# Patient Record
Sex: Female | Born: 1960 | Race: Black or African American | Hispanic: No | Smoking: Current every day smoker
Health system: Southern US, Community
[De-identification: ages and names within clinical notes are randomized; demographics above are authoritative.]

## PROBLEM LIST (undated history)

## (undated) HISTORY — PX: ENDOMETRIAL ABLATION: SHX621

## (undated) HISTORY — PX: BREAST CYST EXCISION: SHX579

---

## 2008-06-15 ENCOUNTER — Other Ambulatory Visit: Admission: RE | Admit: 2008-06-15 | Discharge: 2008-06-15 | Payer: Self-pay | Admitting: Obstetrics and Gynecology

## 2008-12-09 ENCOUNTER — Ambulatory Visit (HOSPITAL_COMMUNITY): Admission: RE | Admit: 2008-12-09 | Discharge: 2008-12-09 | Payer: Self-pay | Admitting: Obstetrics and Gynecology

## 2011-03-12 ENCOUNTER — Encounter (HOSPITAL_COMMUNITY)
Admission: RE | Admit: 2011-03-12 | Discharge: 2011-03-12 | Payer: Self-pay | Source: Ambulatory Visit | Attending: General Surgery | Admitting: General Surgery

## 2011-03-12 MED ORDER — ENOXAPARIN SODIUM 40 MG/0.4ML ~~LOC~~ SOLN: 40.0000 mg | Freq: Once | SUBCUTANEOUS | Status: DC

## 2011-03-12 MED ORDER — CEFAZOLIN SODIUM 1-5 GM-% IV SOLN: 1.0000 g | INTRAVENOUS | Status: DC

## 2011-03-12 NOTE — H&P (Signed)
  NTS SOAP Note  Vital Signs:  Vitals as of: 02/20/2011: Systolic 139: Diastolic 63: Heart Rate 77: Temp 96.31F: Height 56ft 2in: Weight 134Lbs 0 Ounces: Pain Level 0: BMI 25  BMI : 24.51 kg/m2  Subjective: This 50 Years 36 Months old Female presents for of L breast mass.  Patient noted change in L upper outer breast near nipple approx 3 months ago.  Per patient, this has increased slightly over that time.  No fevers, chills.  No nipple d/c or changes.  No skin changes.  No similar issues or nodules in the past.  No sig family history.  No issues noted with R breast.  Review of Symptoms:  Constitutional:unremarkable Head:unremarkable Eyes:unremarkable Nose/Mouth/Throat:unremarkable Cardiovascular:unremarkable Respiratory:unremarkable Gastrointestinal:unremarkable Genitourinary:unremarkable Musculoskeletal:unremarkable Skin:unremarkable as per HPI Hematolgic/Lymphatic:unremarkable Allergic/Immunologic:unremarkable   Past Medical History:Obtained   Past Medical History  Pregnancy Gravida: 2 Pregnancy Para: 2 Surgical History: hysterectomy Medical Problems: seizures Psychiatric History: none Allergies: NKDA Medications: seizure med ? name   Social History:Obtained   Social History  Preferred Language: English ([place]) Race:  White Ethnicity: Not Hispanic / Latino Age: 50 Years 5 Months Marital Status:  M Alcohol:  No Recreational drug(s):  No   Smoking Status: Current every day smoker reviewed on 02/20/2011 Started Date: 05/22/1983 Packs per day: 1.00   Family History:Obtained   Family History  Is there a family history of:HTN, CAD    Objective Information: General:Well appearing, well nourished in no distress. Skin:no rash or prominent lesions Head:Atraumatic; no masses; no abnormalities Eyes:conjunctiva clear, EOM intact, PERRL Mouth:Mucous membranes moist, no mucosal  lesions. Neck:Supple without lymphadenopathy.  Heart:RRR, no murmur Lungs:CTA bilaterally, no wheezes, rhonchi, rales.  Breathing unlabored. L breast, small 1-2cm nodule UOQ.  Mobile.  Non-tender.  Firm.  No fluctuants. Abdomen:Soft, NT/ND, no HSM, no masses. Extremities:No deformities, clubbing, cyanosis, or edema.  Lymphatics:unremarkable  Assessment:   Diagnosis &amp; Procedure: DiagnosisCode: 174.9, ProcedureCode: 16109,    PO Final Path: Positive for malignancy PO UEA:[id number]  Plan:  L sided breast CA.  Surgical options discussed with patient.  Will need to proceed.  Patient is to consider and decide on partial vs total.  Benefits and disadvantages discussed.  Will tentatively schedule as total with [facility] bx but patient will call.  Follow-up:Pending Surgery

## 2011-03-16 ENCOUNTER — Encounter (HOSPITAL_COMMUNITY): Admission: RE | Payer: Self-pay | Source: Ambulatory Visit

## 2011-03-16 ENCOUNTER — Inpatient Hospital Stay (HOSPITAL_COMMUNITY): Admission: RE | Admit: 2011-03-16 | Payer: Self-pay | Source: Ambulatory Visit | Admitting: General Surgery

## 2011-03-16 SURGERY — MASTECTOMY WITH SENTINEL LYMPH NODE BIOPSY
Anesthesia: General | Laterality: Left

## 2011-07-26 ENCOUNTER — Other Ambulatory Visit (HOSPITAL_COMMUNITY): Payer: Self-pay | Admitting: Family Medicine

## 2011-07-26 DIAGNOSIS — Z139 Encounter for screening, unspecified: Secondary | ICD-10-CM

## 2011-07-30 ENCOUNTER — Inpatient Hospital Stay (HOSPITAL_COMMUNITY): Admission: RE | Admit: 2011-07-30 | Payer: Self-pay | Source: Ambulatory Visit

## 2011-08-10 ENCOUNTER — Ambulatory Visit (HOSPITAL_COMMUNITY): Payer: Self-pay

## 2011-10-31 ENCOUNTER — Other Ambulatory Visit (HOSPITAL_COMMUNITY)
Admission: RE | Admit: 2011-10-31 | Discharge: 2011-10-31 | Disposition: A | Discharge status: Home / Self Care | Payer: Self-pay | Source: Ambulatory Visit | Attending: Obstetrics and Gynecology | Admitting: Obstetrics and Gynecology

## 2011-10-31 ENCOUNTER — Other Ambulatory Visit: Payer: Self-pay | Admitting: Obstetrics and Gynecology

## 2011-10-31 DIAGNOSIS — Z01419 Encounter for gynecological examination (general) (routine) without abnormal findings: Secondary | ICD-10-CM | POA: Insufficient documentation

## 2012-01-16 ENCOUNTER — Other Ambulatory Visit: Payer: Self-pay | Admitting: Obstetrics and Gynecology

## 2012-08-12 ENCOUNTER — Other Ambulatory Visit: Payer: Self-pay | Admitting: Obstetrics and Gynecology

## 2012-08-13 ENCOUNTER — Other Ambulatory Visit: Payer: Self-pay | Admitting: Obstetrics and Gynecology

## 2012-12-01 ENCOUNTER — Encounter: Payer: Self-pay | Admitting: *Deleted

## 2012-12-01 ENCOUNTER — Other Ambulatory Visit: Payer: Self-pay | Admitting: Obstetrics and Gynecology

## 2012-12-17 ENCOUNTER — Ambulatory Visit: Payer: Self-pay | Admitting: Obstetrics and Gynecology

## 2012-12-22 ENCOUNTER — Ambulatory Visit: Payer: Self-pay | Admitting: Obstetrics and Gynecology

## 2013-01-21 ENCOUNTER — Other Ambulatory Visit: Payer: Self-pay | Admitting: Obstetrics and Gynecology

## 2013-01-21 ENCOUNTER — Other Ambulatory Visit: Payer: Self-pay | Admitting: *Deleted

## 2013-01-21 MED ORDER — IBUPROFEN 600 MG PO TABS: 600.0000 mg | ORAL_TABLET | Freq: Four times a day (QID) | ORAL | Status: DC | PRN

## 2013-07-19 ENCOUNTER — Emergency Department (HOSPITAL_COMMUNITY)
Admission: EM | Admit: 2013-07-19 | Discharge: 2013-07-19 | Disposition: A | Discharge status: Home / Self Care | Payer: 59 | Attending: Emergency Medicine | Admitting: Emergency Medicine

## 2013-07-19 ENCOUNTER — Encounter (HOSPITAL_COMMUNITY): Payer: Self-pay | Admitting: Emergency Medicine

## 2013-07-19 ENCOUNTER — Emergency Department (HOSPITAL_COMMUNITY): Payer: 59

## 2013-07-19 DIAGNOSIS — B9689 Other specified bacterial agents as the cause of diseases classified elsewhere: Secondary | ICD-10-CM | POA: Insufficient documentation

## 2013-07-19 DIAGNOSIS — N760 Acute vaginitis: Secondary | ICD-10-CM

## 2013-07-19 DIAGNOSIS — R197 Diarrhea, unspecified: Secondary | ICD-10-CM | POA: Insufficient documentation

## 2013-07-19 DIAGNOSIS — A499 Bacterial infection, unspecified: Secondary | ICD-10-CM | POA: Insufficient documentation

## 2013-07-19 DIAGNOSIS — N898 Other specified noninflammatory disorders of vagina: Secondary | ICD-10-CM | POA: Insufficient documentation

## 2013-07-19 DIAGNOSIS — F17200 Nicotine dependence, unspecified, uncomplicated: Secondary | ICD-10-CM | POA: Insufficient documentation

## 2013-07-19 DIAGNOSIS — Z3202 Encounter for pregnancy test, result negative: Secondary | ICD-10-CM | POA: Insufficient documentation

## 2013-07-19 DIAGNOSIS — N939 Abnormal uterine and vaginal bleeding, unspecified: Secondary | ICD-10-CM

## 2013-07-19 LAB — URINALYSIS, ROUTINE W REFLEX MICROSCOPIC
Bilirubin Urine: NEGATIVE
Glucose, UA: NEGATIVE mg/dL
Ketones, ur: NEGATIVE mg/dL
Leukocytes, UA: NEGATIVE
Nitrite: NEGATIVE
Protein, ur: NEGATIVE mg/dL
Specific Gravity, Urine: 1.01 (ref 1.005–1.030)
Urobilinogen, UA: 0.2 mg/dL (ref 0.0–1.0)
pH: 6 (ref 5.0–8.0)

## 2013-07-19 LAB — PREGNANCY, URINE: Preg Test, Ur: NEGATIVE

## 2013-07-19 LAB — CBC WITH DIFFERENTIAL/PLATELET
Basophils Absolute: 0 10*3/uL (ref 0.0–0.1)
Basophils Relative: 0 % (ref 0–1)
Eosinophils Absolute: 0.1 10*3/uL (ref 0.0–0.7)
Eosinophils Relative: 2 % (ref 0–5)
HCT: 39.9 % (ref 36.0–46.0)
Hemoglobin: 13 g/dL (ref 12.0–15.0)
Lymphocytes Relative: 40 % (ref 12–46)
Lymphs Abs: 2.4 10*3/uL (ref 0.7–4.0)
MCH: 29.5 pg (ref 26.0–34.0)
MCHC: 32.6 g/dL (ref 30.0–36.0)
MCV: 90.7 fL (ref 78.0–100.0)
Monocytes Absolute: 0.8 10*3/uL (ref 0.1–1.0)
Monocytes Relative: 13 % — ABNORMAL HIGH (ref 3–12)
Neutro Abs: 2.8 10*3/uL (ref 1.7–7.7)
Neutrophils Relative %: 45 % (ref 43–77)
Platelets: 295 10*3/uL (ref 150–400)
RBC: 4.4 MIL/uL (ref 3.87–5.11)
RDW: 16.3 % — ABNORMAL HIGH (ref 11.5–15.5)
WBC: 6.1 10*3/uL (ref 4.0–10.5)

## 2013-07-19 LAB — BASIC METABOLIC PANEL
BUN: 9 mg/dL (ref 6–23)
CO2: 30 mEq/L (ref 19–32)
Calcium: 9.8 mg/dL (ref 8.4–10.5)
Chloride: 99 mEq/L (ref 96–112)
Creatinine, Ser: 0.66 mg/dL (ref 0.50–1.10)
GFR calc Af Amer: >90 mL/min (ref >90)
GFR calc non Af Amer: >90 mL/min (ref >90)
Glucose, Bld: 87 mg/dL (ref 70–99)
Potassium: 3.7 mEq/L (ref 3.7–5.3)
Sodium: 138 mEq/L (ref 137–147)

## 2013-07-19 LAB — WET PREP, GENITAL
Trich, Wet Prep: NONE SEEN
Yeast Wet Prep HPF POC: NONE SEEN

## 2013-07-19 LAB — URINE MICROSCOPIC-ADD ON

## 2013-07-19 MED ORDER — ONDANSETRON HCL 4 MG/2ML IJ SOLN
4.0000 mg | Freq: Once | INTRAMUSCULAR | Status: DC
  Filled 2013-07-19: qty 2

## 2013-07-19 MED ORDER — PROMETHAZINE HCL 25 MG PO TABS: 25.0000 mg | ORAL_TABLET | Freq: Four times a day (QID) | ORAL | Status: AC | PRN

## 2013-07-19 MED ORDER — IOHEXOL 300 MG/ML  SOLN
50.0000 mL | Freq: Once | INTRAMUSCULAR | Status: AC | PRN
  Administered 2013-07-19: 50 mL via ORAL

## 2013-07-19 MED ORDER — OXYCODONE-ACETAMINOPHEN 5-325 MG PO TABS: 2.0000 | ORAL_TABLET | ORAL | Status: AC | PRN

## 2013-07-19 MED ORDER — ONDANSETRON HCL 4 MG/2ML IJ SOLN
4.0000 mg | Freq: Once | INTRAMUSCULAR | Status: AC
  Administered 2013-07-19: 4 mg via INTRAVENOUS

## 2013-07-19 MED ORDER — METRONIDAZOLE 500 MG PO TABS: 500.0000 mg | ORAL_TABLET | Freq: Two times a day (BID) | ORAL | Status: AC

## 2013-07-19 MED ORDER — MORPHINE SULFATE 4 MG/ML IJ SOLN
4.0000 mg | Freq: Once | INTRAMUSCULAR | Status: AC
  Administered 2013-07-19: 4 mg via INTRAVENOUS
  Filled 2013-07-19: qty 1

## 2013-07-19 MED ORDER — IOHEXOL 300 MG/ML  SOLN
100.0000 mL | Freq: Once | INTRAMUSCULAR | Status: AC | PRN
  Administered 2013-07-19: 100 mL via INTRAVENOUS

## 2013-07-19 MED ORDER — SODIUM CHLORIDE 0.9 % IV BOLUS (SEPSIS)
1000.0000 mL | Freq: Once | INTRAVENOUS | Status: AC
  Administered 2013-07-19: 1000 mL via INTRAVENOUS

## 2013-07-19 MED ORDER — ONDANSETRON HCL 4 MG/2ML IJ SOLN
4.0000 mg | Freq: Once | INTRAMUSCULAR | Status: AC
  Administered 2013-07-19: 4 mg via INTRAVENOUS
  Filled 2013-07-19: qty 2

## 2013-07-19 NOTE — ED Notes (Signed)
Pt with abd pain since Monday, diarrhea as well, denies N/V or fever, also with menses started on Monday as well, + vaginal bleeding, states she had procedure to prevent bleeding, also states passing blood clots

## 2013-07-19 NOTE — Discharge Instructions (Signed)
Abnormal Uterine Bleeding Abnormal uterine bleeding means bleeding from the vagina that is not your normal menstrual period. This can be:  Bleeding or spotting between periods.  Bleeding after sex (sexual intercourse).  Bleeding that is heavier or more than normal.  Periods that last longer than usual.  Bleeding after menopause. There are many problems that may cause this. Treatment will depend on the cause of the bleeding. Any kind of bleeding that is not normal should be reviewed by your doctor.  HOME CARE Watch your condition for any changes. These actions may lessen any discomfort you are having:  Do not use tampons or douches as told by your doctor.  Change your pads often. You should get regular pelvic exams and Pap tests. Keep all appointments for tests as told by your doctor. GET HELP IF:  You are bleeding for more than 1 week.  You feel dizzy at times. GET HELP RIGHT AWAY IF:   You pass out.  You have to change pads every 15 to 30 minutes.  You have belly pain.  You have a fever.  You become sweaty or weak.  You are passing large blood clots from the vagina.  You feel sick to your stomach (nauseous) and throw up (vomit). MAKE SURE YOU:  Understand these instructions.  Will watch your condition.  Will get help right away if you are not doing well or get worse. Document Released: 03/04/2009 Document Revised: 02/25/2013 Document Reviewed: 12/04/2012 [facility] Patient Information 2014 [place], [place].  Return tomorrow for an ultrasound of your pelvis. Nurse will give you the correct time. Medication for pain and nausea. You will ultimately will need to followup with your OB/GYN doctor

## 2013-07-19 NOTE — ED Provider Notes (Addendum)
CSN: [id number]     Arrival date & time 07/19/13  1632 History  This chart was scribed for Stacey Christen, MD by Stacey Adams, ED Scribe. This patient was seen in room APA05/APA05 and the patient's care was started at 5:13 PM.   Chief Complaint  Patient presents with  . Abdominal Pain   The history is provided by the patient. No language interpreter was used.    HPI Comments: Stacey Adams is a 53 y.o. female who presents to the Emergency Department complaining of severe vaginal bleeding for the past 6 days with associated LLQ pain. She reports a decreased appetite secondary to pain . She states that she became concerned this afternoon when she had a rush of blood about 2 hours ago. She reports that the LLQ pain started as a nagging sensation but has been gradually worsening since. She has tried Tylenol with mild improvement. Pt had an ablation done on November 3rd, 2014 by Dr. Evie Lacks in [place] for menorrhagia caused by uterine fibroids. She states that she is not supposed to have any further vaginal bleeding or menses and denies having any further episodes of vaginal bleeding up until 6 days ago. She also reports have diarrhea for the past 6 days as well but denies any nausea or emesis. She denies any chronic medical conditions.   Works as a Water engineer    History reviewed. No pertinent past medical history. Past Surgical History  Procedure Laterality Date  . Endometrial ablation     History reviewed. No pertinent family history. History  Substance Use Topics  . Smoking status: Current Every Day Smoker    Types: Cigarettes  . Smokeless tobacco: Not on file  . Alcohol Use: No   No OB history provided.  Review of Systems  A complete 10 system review of systems was obtained and all systems are negative except as noted in the HPI and PMH.    Allergies  Review of patient's allergies indicates no known allergies.  Home Medications   Current Outpatient Rx  Name  Route  Sig   Dispense  Refill  . oxyCODONE-acetaminophen (PERCOCET) 5-325 MG per tablet   Oral   Take 2 tablets by mouth every 4 (four) hours as needed.   20 tablet   0   . promethazine (PHENERGAN) 25 MG tablet   Oral   Take 1 tablet (25 mg total) by mouth every 6 (six) hours as needed.   15 tablet   0    Triage Vitals: BP 167/73  Pulse 71  Temp(Src) 98.2 F (36.8 C) (Oral)  Resp 16  Ht 5\' 6"  (1.676 m)  Wt 215 lb (97.523 kg)  BMI 34.72 kg/m2  SpO2 100%  LMP 07/13/2013  Physical Exam  Nursing note and vitals reviewed. Constitutional: She is oriented to person, place, and time. She appears well-developed and well-nourished.  HENT:  Head: Normocephalic and atraumatic.  Eyes: Conjunctivae and EOM are normal. Pupils are equal, round, and reactive to light.  Neck: Normal range of motion. Neck supple.  Cardiovascular: Normal rate, regular rhythm and normal heart sounds.   Pulmonary/Chest: Effort normal and breath sounds normal.  Abdominal: Soft. Bowel sounds are normal. There is tenderness (minimal LLQ).  Genitourinary:  External genitalia normal. No cervical motion tenderness. Cervix closed. Minimal blood in vault. Adnexa normal.  Musculoskeletal: Normal range of motion.  Neurological: She is alert and oriented to person, place, and time.  Skin: Skin is warm and dry.  Psychiatric: She has  a normal mood and affect. Her behavior is normal.    ED Course  Procedures (including critical care time)  DIAGNOSTIC STUDIES: Oxygen Saturation is 100% on RA, nml by my interpretation.    COORDINATION OF CARE: 5:18 PM-Discussed treatment plan which includes pelvic exam and CT of abdomen with pt at bedside and pt agreed to plan.   Labs Review Labs Reviewed  WET PREP, GENITAL - Abnormal; Notable for the following:    Clue Cells Wet Prep HPF POC MODERATE (*)    WBC, Wet Prep HPF POC FEW (*)    All other components within normal limits  CBC WITH DIFFERENTIAL - Abnormal; Notable for the following:     RDW 16.3 (*)    Monocytes Relative 13 (*)    All other components within normal limits  URINALYSIS, ROUTINE W REFLEX MICROSCOPIC - Abnormal; Notable for the following:    Hgb urine dipstick SMALL (*)    All other components within normal limits  GC/CHLAMYDIA PROBE AMP  BASIC METABOLIC PANEL  PREGNANCY, URINE  URINE MICROSCOPIC-ADD ON   Imaging Review Ct Abdomen Pelvis W Contrast  07/19/2013   CLINICAL DATA:  Recent endometrial ablation. Left lower quadrant pain and vaginal bleeding.  EXAM: CT ABDOMEN AND PELVIS WITH CONTRAST  TECHNIQUE: Multidetector CT imaging of the abdomen and pelvis was performed using the standard protocol following bolus administration of intravenous contrast.  CONTRAST:  29mL OMNIPAQUE IOHEXOL 300 MG/ML SOLN, 169mL OMNIPAQUE IOHEXOL 300 MG/ML SOLN  COMPARISON:  None.  FINDINGS: The endometrial canal demonstrates fluid attenuation and is widened to a maximum of 14 mm. There is some intermediate and somewhat heterogeneous material within the endometrial canal in the lower uterine segment. This may reflect thrombus. The uterus is mildly enlarged. It has a lobulated contour consistent with multiple fibroids. There is no evidence of a uterine perforation. No adnexal masses.  Clear lung bases. The heart is normal in size. There is a fluid attenuation oval mass just inferior to the inferior right pulmonary vein. This is likely a small bronchogenic cyst. It measures 2 cm in size. This is not felt to need additional evaluation.  Liver, spleen, gallbladder pancreas are normal. No bile duct dilation. No adrenal masses. Normal kidneys, ureters and bladder. No adenopathy. No ascites. There are scattered colonic diverticula. No diverticulitis. Bowel is otherwise unremarkable. Normal appendix.  There is loss of disc height with mild disc bulging at L4-L5. Bony structures otherwise unremarkable.  IMPRESSION: 1. Fluid mildly distends the endometrial canal. There is a material of heterogeneous  attenuation within the lower endometrial canal that may reflect thrombus. Consider endometritis in the proper clinical setting. 2. Uterus is mildly enlarged and heterogeneous with a lobulated contour consistent with multiple fibroids. 3. No evidence of uterine perforation. 4. Probable small bronchogenic cyst adjacent to the right inferior pulmonary vein. 5. Scattered colonic diverticula.  No diverticulitis. 6. Mild degenerative disc disease at L4-L5. Next and no other abnormalities.   Electronically Signed   By: Lajean Manes M.D.   On: 07/19/2013 19:18     EKG Interpretation None      MDM   Final diagnoses:  Vaginal bleeding  No acute abdomen. CT scan shows no acute findings. Will schedule ultrasound of pelvis for tomorrow. Patient will followup with her gynecologist. Discharge medications Percocet and Phenergan 25 mg... also Rx Flagyl 500 mg twice a day forone week  bacterial vaginosis  I personally performed the services described in this documentation, which was scribed in my presence.  The recorded information has been reviewed and is accurate.       Stacey Christen, MD 07/19/13 [id number]  Stacey Christen, MD 07/22/13 [place], MD 07/22/13 [phone]

## 2013-07-20 ENCOUNTER — Other Ambulatory Visit (HOSPITAL_COMMUNITY): Payer: Self-pay | Admitting: Emergency Medicine

## 2013-07-20 ENCOUNTER — Ambulatory Visit (HOSPITAL_COMMUNITY)
Admit: 2013-07-20 | Discharge: 2013-07-20 | Disposition: A | Discharge status: Home / Self Care | Payer: 59 | Attending: Emergency Medicine | Admitting: Emergency Medicine

## 2013-07-20 DIAGNOSIS — N920 Excessive and frequent menstruation with regular cycle: Secondary | ICD-10-CM | POA: Insufficient documentation

## 2013-07-20 DIAGNOSIS — D259 Leiomyoma of uterus, unspecified: Secondary | ICD-10-CM | POA: Insufficient documentation

## 2013-07-20 DIAGNOSIS — N939 Abnormal uterine and vaginal bleeding, unspecified: Secondary | ICD-10-CM

## 2013-07-20 NOTE — ED Provider Notes (Signed)
[place] with endometrial polyp r/o CA; Pt aware had Gyn f/u in 2 days.  Babette Relic, MD 07/20/13 [phone]

## 2013-07-21 LAB — GC/CHLAMYDIA PROBE AMP
CT Probe RNA: NEGATIVE
GC Probe RNA: NEGATIVE

## 2014-01-30 ENCOUNTER — Other Ambulatory Visit: Payer: Self-pay | Admitting: Obstetrics and Gynecology

## 2014-02-03 ENCOUNTER — Other Ambulatory Visit: Payer: Self-pay | Admitting: Obstetrics and Gynecology

## 2014-04-01 ENCOUNTER — Ambulatory Visit: Payer: Self-pay | Admitting: Obstetrics and Gynecology

## 2014-05-31 ENCOUNTER — Other Ambulatory Visit: Payer: Self-pay | Admitting: Obstetrics and Gynecology

## 2014-09-02 ENCOUNTER — Other Ambulatory Visit (HOSPITAL_COMMUNITY): Payer: Self-pay | Admitting: Family Medicine

## 2014-09-02 ENCOUNTER — Ambulatory Visit (HOSPITAL_COMMUNITY)
Admission: RE | Admit: 2014-09-02 | Discharge: 2014-09-02 | Disposition: A | Discharge status: Home / Self Care | Payer: 59 | Source: Ambulatory Visit | Attending: Family Medicine | Admitting: Family Medicine

## 2014-09-02 DIAGNOSIS — M25461 Effusion, right knee: Secondary | ICD-10-CM | POA: Diagnosis not present

## 2014-09-02 DIAGNOSIS — M25561 Pain in right knee: Secondary | ICD-10-CM

## 2016-06-12 ENCOUNTER — Other Ambulatory Visit (HOSPITAL_COMMUNITY): Payer: Self-pay | Admitting: Family Medicine

## 2016-06-12 DIAGNOSIS — Z1231 Encounter for screening mammogram for malignant neoplasm of breast: Secondary | ICD-10-CM

## 2016-06-14 ENCOUNTER — Ambulatory Visit (HOSPITAL_COMMUNITY)
Admission: RE | Admit: 2016-06-14 | Discharge: 2016-06-14 | Disposition: A | Discharge status: Home / Self Care | Payer: 59 | Source: Ambulatory Visit | Attending: Family Medicine | Admitting: Family Medicine

## 2016-06-14 DIAGNOSIS — Z1231 Encounter for screening mammogram for malignant neoplasm of breast: Secondary | ICD-10-CM

## 2018-02-04 DIAGNOSIS — Z6839 Body mass index (BMI) 39.0-39.9, adult: Secondary | ICD-10-CM | POA: Diagnosis not present

## 2018-02-04 DIAGNOSIS — M1991 Primary osteoarthritis, unspecified site: Secondary | ICD-10-CM | POA: Diagnosis not present

## 2018-02-04 DIAGNOSIS — M130 Polyarthritis, unspecified: Secondary | ICD-10-CM | POA: Diagnosis not present

## 2018-02-04 DIAGNOSIS — M2550 Pain in unspecified joint: Secondary | ICD-10-CM | POA: Diagnosis not present

## 2018-02-04 DIAGNOSIS — Z1389 Encounter for screening for other disorder: Secondary | ICD-10-CM | POA: Diagnosis not present

## 2018-06-09 DIAGNOSIS — Z1389 Encounter for screening for other disorder: Secondary | ICD-10-CM | POA: Diagnosis not present

## 2018-06-09 DIAGNOSIS — Z6839 Body mass index (BMI) 39.0-39.9, adult: Secondary | ICD-10-CM | POA: Diagnosis not present

## 2018-06-09 DIAGNOSIS — M130 Polyarthritis, unspecified: Secondary | ICD-10-CM | POA: Diagnosis not present

## 2018-06-09 DIAGNOSIS — F419 Anxiety disorder, unspecified: Secondary | ICD-10-CM | POA: Diagnosis not present

## 2018-10-14 DIAGNOSIS — F419 Anxiety disorder, unspecified: Secondary | ICD-10-CM | POA: Diagnosis not present

## 2018-10-14 DIAGNOSIS — Z6839 Body mass index (BMI) 39.0-39.9, adult: Secondary | ICD-10-CM | POA: Diagnosis not present

## 2018-10-14 DIAGNOSIS — Z1389 Encounter for screening for other disorder: Secondary | ICD-10-CM | POA: Diagnosis not present

## 2019-02-26 DIAGNOSIS — F419 Anxiety disorder, unspecified: Secondary | ICD-10-CM | POA: Diagnosis not present

## 2019-02-26 DIAGNOSIS — F330 Major depressive disorder, recurrent, mild: Secondary | ICD-10-CM | POA: Diagnosis not present

## 2019-02-26 DIAGNOSIS — Z6838 Body mass index (BMI) 38.0-38.9, adult: Secondary | ICD-10-CM | POA: Diagnosis not present

## 2019-02-26 DIAGNOSIS — Z23 Encounter for immunization: Secondary | ICD-10-CM | POA: Diagnosis not present

## 2019-02-26 DIAGNOSIS — M150 Primary generalized (osteo)arthritis: Secondary | ICD-10-CM | POA: Diagnosis not present

## 2019-04-06 DIAGNOSIS — L509 Urticaria, unspecified: Secondary | ICD-10-CM | POA: Diagnosis not present

## 2019-04-06 DIAGNOSIS — Z6837 Body mass index (BMI) 37.0-37.9, adult: Secondary | ICD-10-CM | POA: Diagnosis not present

## 2019-04-28 ENCOUNTER — Other Ambulatory Visit: Payer: Self-pay

## 2019-04-28 DIAGNOSIS — Z20822 Contact with and (suspected) exposure to covid-19: Secondary | ICD-10-CM

## 2019-04-30 LAB — NOVEL CORONAVIRUS, NAA: SARS-CoV-2, NAA: NOT DETECTED

## 2019-06-03 DIAGNOSIS — Z6837 Body mass index (BMI) 37.0-37.9, adult: Secondary | ICD-10-CM | POA: Diagnosis not present

## 2019-06-03 DIAGNOSIS — Z1151 Encounter for screening for human papillomavirus (HPV): Secondary | ICD-10-CM | POA: Diagnosis not present

## 2019-06-03 DIAGNOSIS — Z01419 Encounter for gynecological examination (general) (routine) without abnormal findings: Secondary | ICD-10-CM | POA: Diagnosis not present

## 2019-06-29 DIAGNOSIS — Z719 Counseling, unspecified: Secondary | ICD-10-CM | POA: Diagnosis not present

## 2019-06-29 DIAGNOSIS — T7840XD Allergy, unspecified, subsequent encounter: Secondary | ICD-10-CM | POA: Diagnosis not present

## 2019-06-29 DIAGNOSIS — Z6836 Body mass index (BMI) 36.0-36.9, adult: Secondary | ICD-10-CM | POA: Diagnosis not present

## 2019-06-29 DIAGNOSIS — L509 Urticaria, unspecified: Secondary | ICD-10-CM | POA: Diagnosis not present

## 2019-06-29 DIAGNOSIS — E7849 Other hyperlipidemia: Secondary | ICD-10-CM | POA: Diagnosis not present

## 2019-06-30 ENCOUNTER — Other Ambulatory Visit (HOSPITAL_COMMUNITY): Payer: Self-pay | Admitting: Family Medicine

## 2019-06-30 DIAGNOSIS — Z1231 Encounter for screening mammogram for malignant neoplasm of breast: Secondary | ICD-10-CM

## 2019-07-02 ENCOUNTER — Other Ambulatory Visit: Payer: Self-pay

## 2019-07-02 ENCOUNTER — Ambulatory Visit (HOSPITAL_COMMUNITY)
Admission: RE | Admit: 2019-07-02 | Discharge: 2019-07-02 | Disposition: A | Discharge status: Home / Self Care | Payer: BC Managed Care – PPO | Source: Ambulatory Visit | Attending: Family Medicine | Admitting: Family Medicine

## 2019-07-02 DIAGNOSIS — Z1231 Encounter for screening mammogram for malignant neoplasm of breast: Secondary | ICD-10-CM | POA: Diagnosis not present

## 2019-08-20 ENCOUNTER — Ambulatory Visit: Payer: BC Managed Care – PPO

## 2019-12-10 DIAGNOSIS — F419 Anxiety disorder, unspecified: Secondary | ICD-10-CM | POA: Diagnosis not present

## 2019-12-10 DIAGNOSIS — Z6837 Body mass index (BMI) 37.0-37.9, adult: Secondary | ICD-10-CM | POA: Diagnosis not present

## 2020-02-08 DIAGNOSIS — Z6836 Body mass index (BMI) 36.0-36.9, adult: Secondary | ICD-10-CM | POA: Diagnosis not present

## 2020-02-08 DIAGNOSIS — M545 Low back pain: Secondary | ICD-10-CM | POA: Diagnosis not present

## 2020-02-08 DIAGNOSIS — Z1389 Encounter for screening for other disorder: Secondary | ICD-10-CM | POA: Diagnosis not present

## 2020-02-08 DIAGNOSIS — E6609 Other obesity due to excess calories: Secondary | ICD-10-CM | POA: Diagnosis not present

## 2020-05-04 DIAGNOSIS — Z6836 Body mass index (BMI) 36.0-36.9, adult: Secondary | ICD-10-CM | POA: Diagnosis not present

## 2020-05-04 DIAGNOSIS — F419 Anxiety disorder, unspecified: Secondary | ICD-10-CM | POA: Diagnosis not present

## 2020-05-04 DIAGNOSIS — E6609 Other obesity due to excess calories: Secondary | ICD-10-CM | POA: Diagnosis not present

## 2020-05-31 ENCOUNTER — Other Ambulatory Visit (HOSPITAL_COMMUNITY): Payer: Self-pay | Admitting: Family Medicine

## 2020-05-31 DIAGNOSIS — Z1231 Encounter for screening mammogram for malignant neoplasm of breast: Secondary | ICD-10-CM

## 2020-06-21 ENCOUNTER — Other Ambulatory Visit: Payer: BC Managed Care – PPO

## 2020-06-21 DIAGNOSIS — J019 Acute sinusitis, unspecified: Secondary | ICD-10-CM | POA: Diagnosis not present

## 2020-06-21 DIAGNOSIS — Z20822 Contact with and (suspected) exposure to covid-19: Secondary | ICD-10-CM

## 2020-06-23 LAB — NOVEL CORONAVIRUS, NAA: SARS-CoV-2, NAA: NOT DETECTED

## 2020-06-23 LAB — SARS-COV-2, NAA 2 DAY TAT

## 2020-06-24 ENCOUNTER — Telehealth: Payer: Self-pay | Admitting: General Practice

## 2020-06-24 DIAGNOSIS — Z30432 Encounter for removal of intrauterine contraceptive device: Secondary | ICD-10-CM | POA: Diagnosis not present

## 2020-06-24 DIAGNOSIS — N938 Other specified abnormal uterine and vaginal bleeding: Secondary | ICD-10-CM | POA: Diagnosis not present

## 2020-06-24 NOTE — Telephone Encounter (Signed)
Patient called to get COVID results. Made her aware they were negative. 

## 2020-07-04 ENCOUNTER — Ambulatory Visit (HOSPITAL_COMMUNITY): Admission: RE | Admit: 2020-07-04 | Payer: BC Managed Care – PPO | Source: Ambulatory Visit

## 2020-08-04 DIAGNOSIS — F17210 Nicotine dependence, cigarettes, uncomplicated: Secondary | ICD-10-CM | POA: Diagnosis not present

## 2020-08-04 DIAGNOSIS — Z6836 Body mass index (BMI) 36.0-36.9, adult: Secondary | ICD-10-CM | POA: Diagnosis not present

## 2020-08-04 DIAGNOSIS — F329 Major depressive disorder, single episode, unspecified: Secondary | ICD-10-CM | POA: Diagnosis not present

## 2020-08-04 DIAGNOSIS — Z1389 Encounter for screening for other disorder: Secondary | ICD-10-CM | POA: Diagnosis not present

## 2020-08-04 DIAGNOSIS — M1991 Primary osteoarthritis, unspecified site: Secondary | ICD-10-CM | POA: Diagnosis not present

## 2020-08-04 DIAGNOSIS — R7309 Other abnormal glucose: Secondary | ICD-10-CM | POA: Diagnosis not present

## 2020-08-04 DIAGNOSIS — M150 Primary generalized (osteo)arthritis: Secondary | ICD-10-CM | POA: Diagnosis not present

## 2020-08-04 DIAGNOSIS — E7849 Other hyperlipidemia: Secondary | ICD-10-CM | POA: Diagnosis not present

## 2020-08-04 DIAGNOSIS — R000 Tachycardia, unspecified: Secondary | ICD-10-CM | POA: Diagnosis not present

## 2020-08-04 DIAGNOSIS — F419 Anxiety disorder, unspecified: Secondary | ICD-10-CM | POA: Diagnosis not present

## 2020-08-04 DIAGNOSIS — Z0001 Encounter for general adult medical examination with abnormal findings: Secondary | ICD-10-CM | POA: Diagnosis not present

## 2020-10-26 DIAGNOSIS — R609 Edema, unspecified: Secondary | ICD-10-CM | POA: Diagnosis not present

## 2020-10-26 DIAGNOSIS — E6609 Other obesity due to excess calories: Secondary | ICD-10-CM | POA: Diagnosis not present

## 2020-10-26 DIAGNOSIS — Z6836 Body mass index (BMI) 36.0-36.9, adult: Secondary | ICD-10-CM | POA: Diagnosis not present

## 2020-10-26 DIAGNOSIS — M1991 Primary osteoarthritis, unspecified site: Secondary | ICD-10-CM | POA: Diagnosis not present

## 2020-11-30 ENCOUNTER — Ambulatory Visit (HOSPITAL_COMMUNITY)
Admission: RE | Admit: 2020-11-30 | Discharge: 2020-11-30 | Disposition: A | Discharge status: Home / Self Care | Payer: BC Managed Care – PPO | Source: Ambulatory Visit | Attending: Family Medicine | Admitting: Family Medicine

## 2020-11-30 ENCOUNTER — Other Ambulatory Visit: Payer: Self-pay

## 2020-11-30 DIAGNOSIS — Z1231 Encounter for screening mammogram for malignant neoplasm of breast: Secondary | ICD-10-CM | POA: Diagnosis not present

## 2021-02-08 DIAGNOSIS — M150 Primary generalized (osteo)arthritis: Secondary | ICD-10-CM | POA: Diagnosis not present

## 2021-02-08 DIAGNOSIS — K219 Gastro-esophageal reflux disease without esophagitis: Secondary | ICD-10-CM | POA: Diagnosis not present

## 2021-02-08 DIAGNOSIS — F419 Anxiety disorder, unspecified: Secondary | ICD-10-CM | POA: Diagnosis not present

## 2021-07-31 IMAGING — MG DIGITAL SCREENING BILAT W/ TOMO W/ CAD
8 series · 8 of 24 positions shown · non-contrast
Comparison: Previous exam(s).

CLINICAL DATA: Screening.

EXAM:
DIGITAL SCREENING BILATERAL MAMMOGRAM WITH TOMO AND CAD

[L MLO synth-2D]
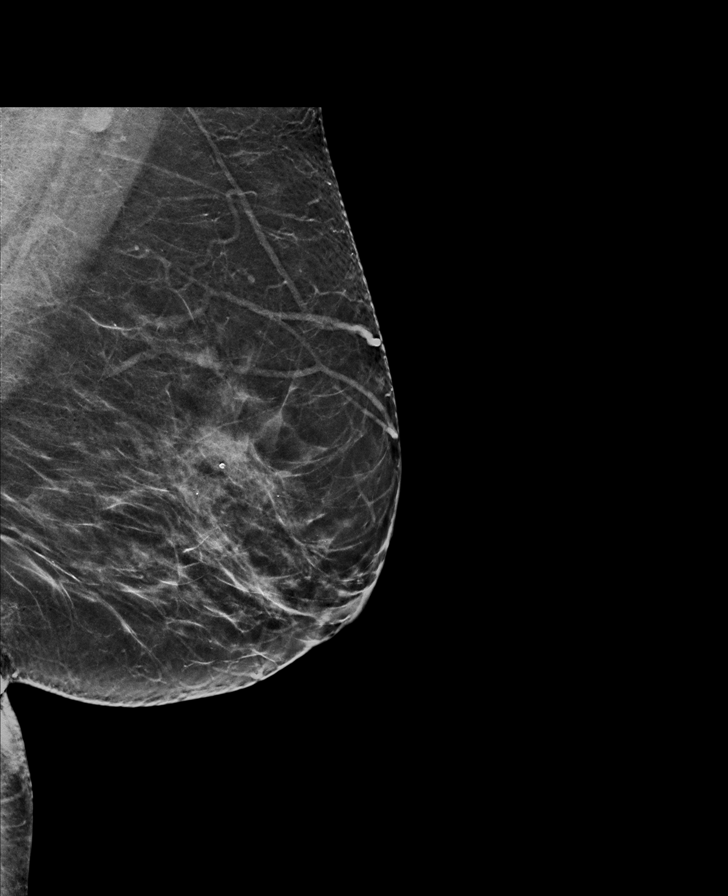

[L CC synth-2D]
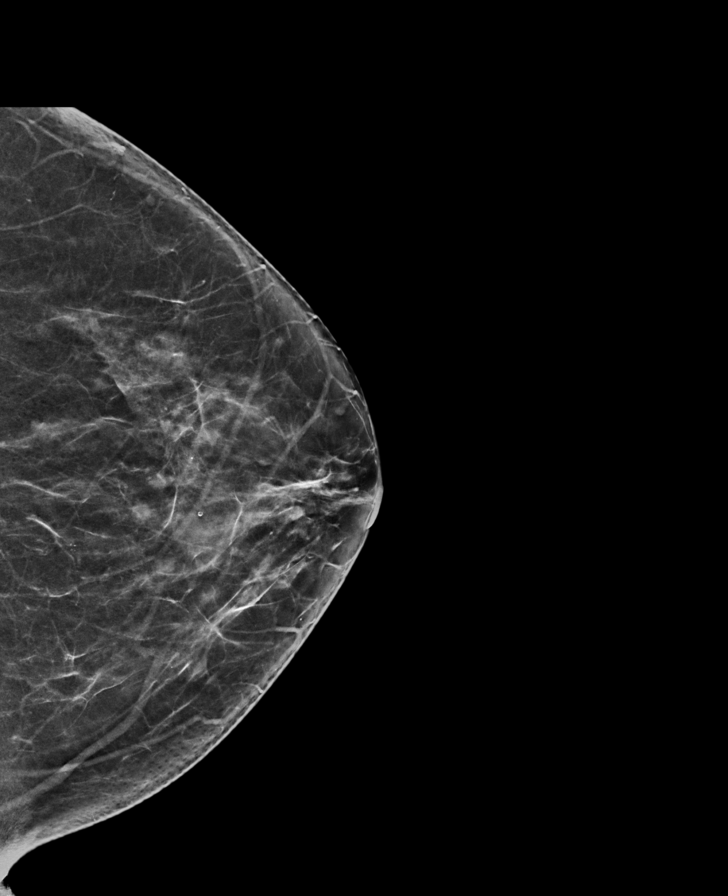

[R MLO synth-2D]
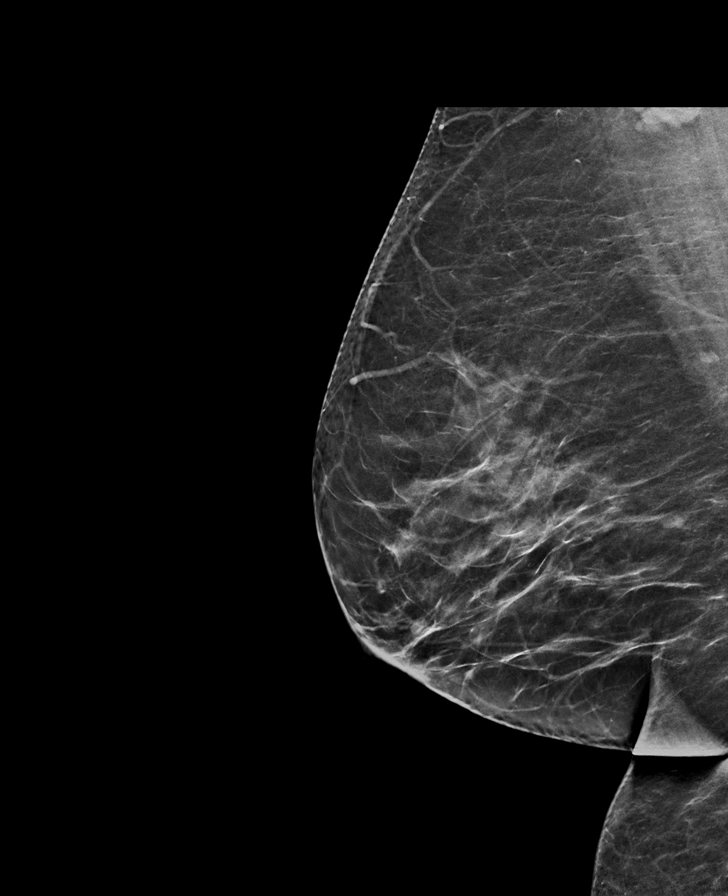

[R CC synth-2D]
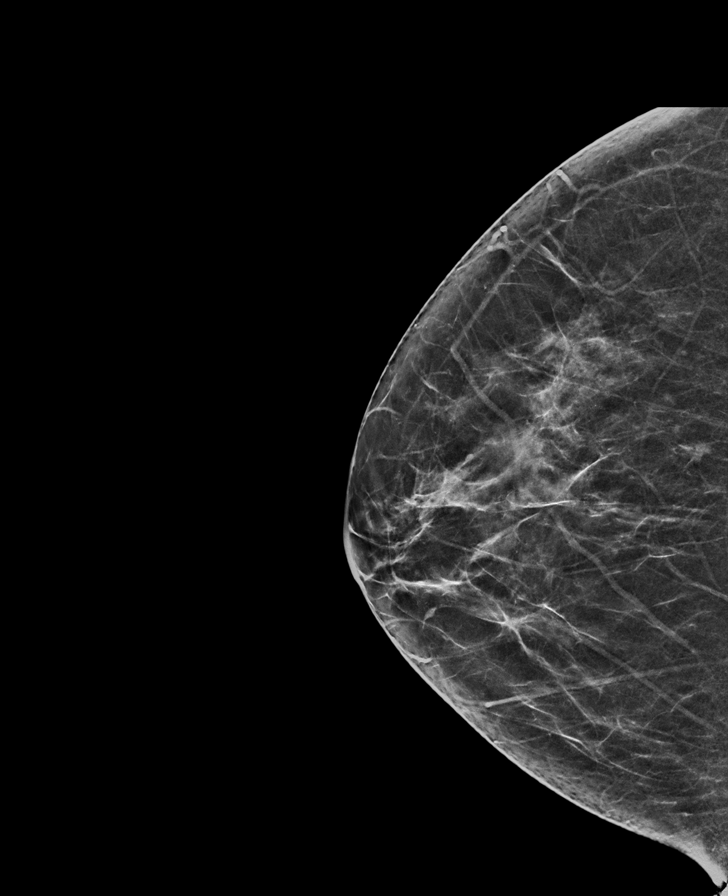

[R MLO tomo · tomo slice 34/67.0]
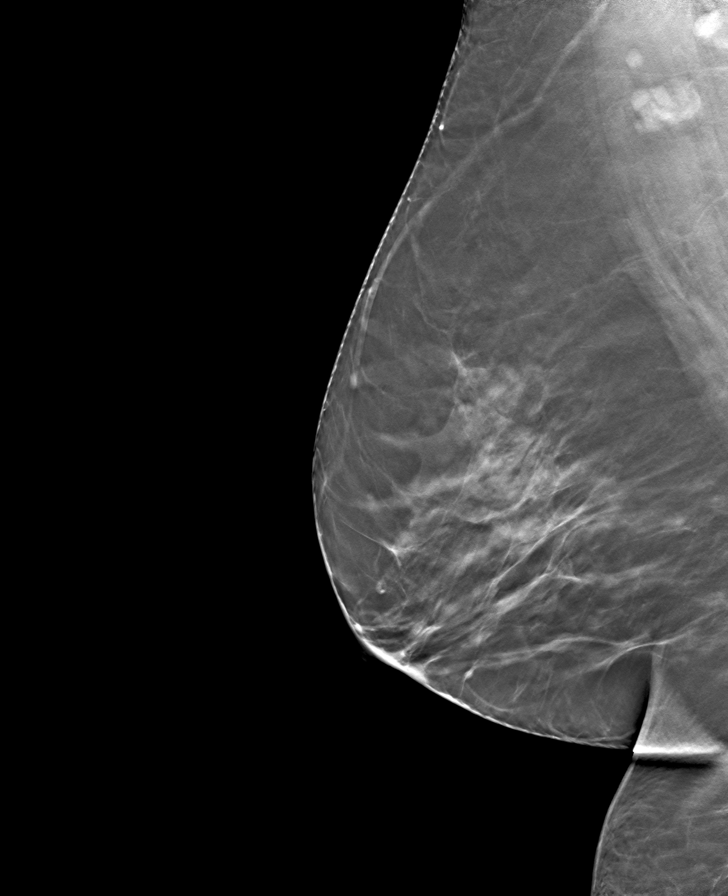

[R CC tomo · tomo slice 33/64.0]
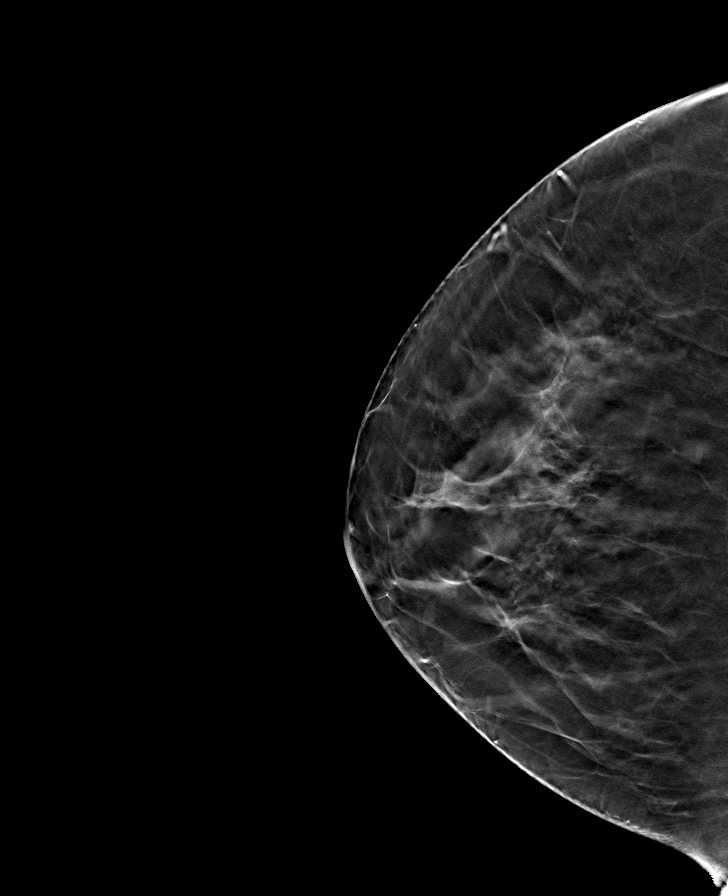

[L CC tomo · tomo slice 34/67.0]
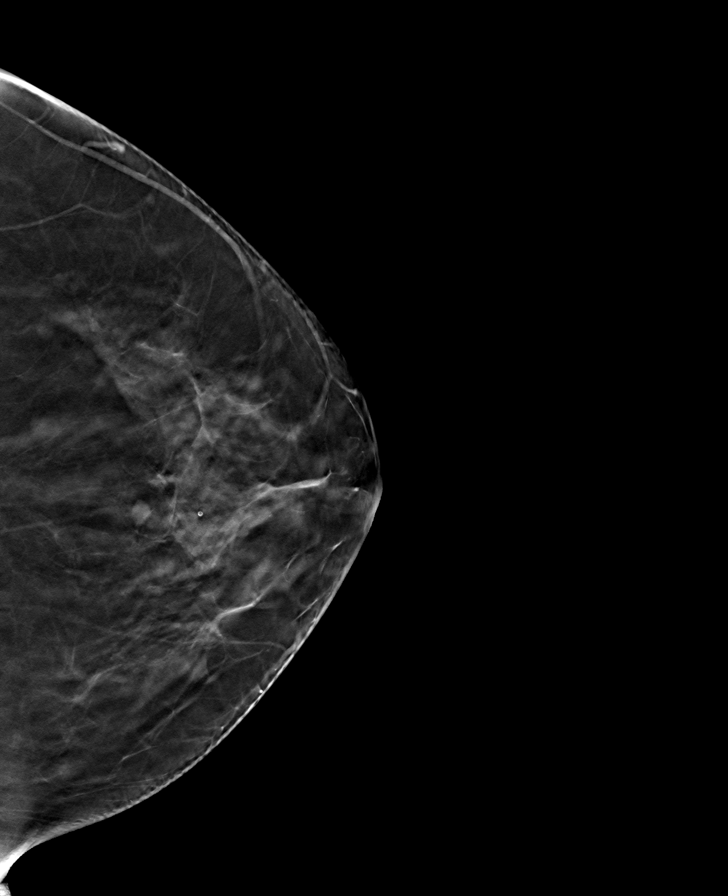

[L MLO tomo · tomo slice 35/70.0]
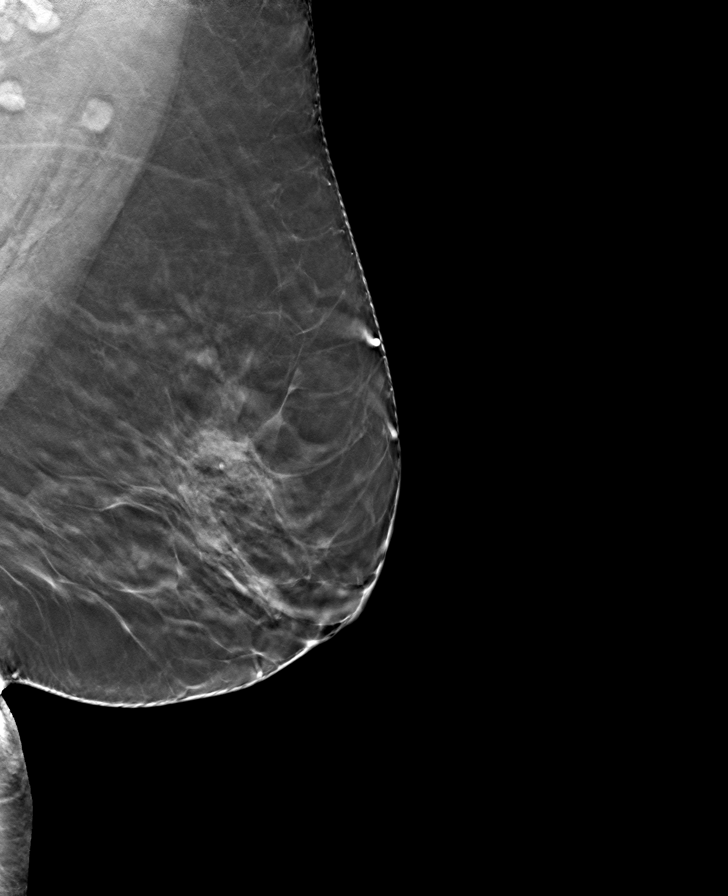

[8 of 24 positions shown; findings below may reference images not displayed]

ACR Breast Density Category b: There are scattered areas of
fibroglandular density.
FINDINGS: There are no findings suspicious for malignancy. Images were
processed with CAD.
IMPRESSION: No mammographic evidence of malignancy. A result letter of this
screening mammogram will be mailed directly to the patient.

RECOMMENDATION:
Screening mammogram in one year. (Code:[id number])

BI-RADS CATEGORY  1: Negative.

## 2021-08-07 DIAGNOSIS — Z1331 Encounter for screening for depression: Secondary | ICD-10-CM | POA: Diagnosis not present

## 2021-08-07 DIAGNOSIS — F330 Major depressive disorder, recurrent, mild: Secondary | ICD-10-CM | POA: Diagnosis not present

## 2021-08-07 DIAGNOSIS — K219 Gastro-esophageal reflux disease without esophagitis: Secondary | ICD-10-CM | POA: Diagnosis not present

## 2021-08-07 DIAGNOSIS — E7849 Other hyperlipidemia: Secondary | ICD-10-CM | POA: Diagnosis not present

## 2021-08-07 DIAGNOSIS — M150 Primary generalized (osteo)arthritis: Secondary | ICD-10-CM | POA: Diagnosis not present

## 2021-08-07 DIAGNOSIS — R7309 Other abnormal glucose: Secondary | ICD-10-CM | POA: Diagnosis not present

## 2021-08-07 DIAGNOSIS — F419 Anxiety disorder, unspecified: Secondary | ICD-10-CM | POA: Diagnosis not present

## 2021-08-07 DIAGNOSIS — E6609 Other obesity due to excess calories: Secondary | ICD-10-CM | POA: Diagnosis not present

## 2021-08-07 DIAGNOSIS — Z6836 Body mass index (BMI) 36.0-36.9, adult: Secondary | ICD-10-CM | POA: Diagnosis not present

## 2021-08-07 DIAGNOSIS — M1991 Primary osteoarthritis, unspecified site: Secondary | ICD-10-CM | POA: Diagnosis not present

## 2021-08-07 DIAGNOSIS — E782 Mixed hyperlipidemia: Secondary | ICD-10-CM | POA: Diagnosis not present

## 2021-08-07 DIAGNOSIS — Z0001 Encounter for general adult medical examination with abnormal findings: Secondary | ICD-10-CM | POA: Diagnosis not present

## 2022-01-01 DIAGNOSIS — F419 Anxiety disorder, unspecified: Secondary | ICD-10-CM | POA: Diagnosis not present

## 2022-01-01 DIAGNOSIS — Z6835 Body mass index (BMI) 35.0-35.9, adult: Secondary | ICD-10-CM | POA: Diagnosis not present

## 2022-01-01 DIAGNOSIS — E6609 Other obesity due to excess calories: Secondary | ICD-10-CM | POA: Diagnosis not present

## 2022-05-30 DIAGNOSIS — F419 Anxiety disorder, unspecified: Secondary | ICD-10-CM | POA: Diagnosis not present

## 2022-08-29 ENCOUNTER — Other Ambulatory Visit (HOSPITAL_COMMUNITY): Payer: Self-pay | Admitting: Family Medicine

## 2022-08-29 DIAGNOSIS — Z1331 Encounter for screening for depression: Secondary | ICD-10-CM | POA: Diagnosis not present

## 2022-08-29 DIAGNOSIS — Z0000 Encounter for general adult medical examination without abnormal findings: Secondary | ICD-10-CM | POA: Diagnosis not present

## 2022-08-29 DIAGNOSIS — Z6836 Body mass index (BMI) 36.0-36.9, adult: Secondary | ICD-10-CM | POA: Diagnosis not present

## 2022-08-29 DIAGNOSIS — E6609 Other obesity due to excess calories: Secondary | ICD-10-CM | POA: Diagnosis not present

## 2022-08-29 DIAGNOSIS — R030 Elevated blood-pressure reading, without diagnosis of hypertension: Secondary | ICD-10-CM | POA: Diagnosis not present

## 2022-08-29 DIAGNOSIS — R7309 Other abnormal glucose: Secondary | ICD-10-CM | POA: Diagnosis not present

## 2022-08-29 DIAGNOSIS — Z1231 Encounter for screening mammogram for malignant neoplasm of breast: Secondary | ICD-10-CM

## 2022-08-30 DIAGNOSIS — E7849 Other hyperlipidemia: Secondary | ICD-10-CM | POA: Diagnosis not present

## 2022-08-30 DIAGNOSIS — R030 Elevated blood-pressure reading, without diagnosis of hypertension: Secondary | ICD-10-CM | POA: Diagnosis not present

## 2022-08-30 DIAGNOSIS — Z0000 Encounter for general adult medical examination without abnormal findings: Secondary | ICD-10-CM | POA: Diagnosis not present

## 2022-09-03 ENCOUNTER — Encounter (HOSPITAL_COMMUNITY): Payer: Self-pay

## 2022-09-03 ENCOUNTER — Ambulatory Visit (HOSPITAL_COMMUNITY)
Admission: RE | Admit: 2022-09-03 | Discharge: 2022-09-03 | Disposition: A | Discharge status: Home / Self Care | Payer: BC Managed Care – PPO | Source: Ambulatory Visit | Attending: Family Medicine | Admitting: Family Medicine

## 2022-09-03 DIAGNOSIS — Z1231 Encounter for screening mammogram for malignant neoplasm of breast: Secondary | ICD-10-CM | POA: Insufficient documentation

## 2022-09-06 ENCOUNTER — Other Ambulatory Visit (HOSPITAL_COMMUNITY): Payer: Self-pay | Admitting: Family Medicine

## 2022-09-06 DIAGNOSIS — R928 Other abnormal and inconclusive findings on diagnostic imaging of breast: Secondary | ICD-10-CM

## 2022-09-25 ENCOUNTER — Encounter (HOSPITAL_COMMUNITY): Payer: BC Managed Care – PPO

## 2022-09-25 ENCOUNTER — Ambulatory Visit (HOSPITAL_COMMUNITY): Payer: BC Managed Care – PPO

## 2022-09-26 ENCOUNTER — Ambulatory Visit (HOSPITAL_COMMUNITY): Payer: BC Managed Care – PPO

## 2022-09-26 ENCOUNTER — Encounter (HOSPITAL_COMMUNITY): Payer: BC Managed Care – PPO

## 2022-10-09 ENCOUNTER — Ambulatory Visit (HOSPITAL_COMMUNITY)
Admission: RE | Admit: 2022-10-09 | Discharge: 2022-10-09 | Disposition: A | Discharge status: Home / Self Care | Payer: BC Managed Care – PPO | Source: Ambulatory Visit | Attending: Family Medicine | Admitting: Family Medicine

## 2022-10-09 ENCOUNTER — Encounter (HOSPITAL_COMMUNITY): Payer: Self-pay

## 2022-10-09 DIAGNOSIS — R928 Other abnormal and inconclusive findings on diagnostic imaging of breast: Secondary | ICD-10-CM

## 2022-10-09 DIAGNOSIS — R922 Inconclusive mammogram: Secondary | ICD-10-CM | POA: Diagnosis not present

## 2023-02-27 DIAGNOSIS — Z6838 Body mass index (BMI) 38.0-38.9, adult: Secondary | ICD-10-CM | POA: Diagnosis not present

## 2023-02-27 DIAGNOSIS — E6609 Other obesity due to excess calories: Secondary | ICD-10-CM | POA: Diagnosis not present

## 2023-02-27 DIAGNOSIS — F419 Anxiety disorder, unspecified: Secondary | ICD-10-CM | POA: Diagnosis not present

## 2023-09-05 DIAGNOSIS — E6609 Other obesity due to excess calories: Secondary | ICD-10-CM | POA: Diagnosis not present

## 2023-09-05 DIAGNOSIS — K219 Gastro-esophageal reflux disease without esophagitis: Secondary | ICD-10-CM | POA: Diagnosis not present

## 2023-09-05 DIAGNOSIS — Z6837 Body mass index (BMI) 37.0-37.9, adult: Secondary | ICD-10-CM | POA: Diagnosis not present

## 2023-09-05 DIAGNOSIS — M150 Primary generalized (osteo)arthritis: Secondary | ICD-10-CM | POA: Diagnosis not present

## 2024-09-23 ENCOUNTER — Ambulatory Visit: Payer: Self-pay
